# Patient Record
Sex: Male | Born: 1985 | Race: Black or African American | Hispanic: No | Marital: Single | Smoking: Former smoker
Health system: Southern US, Community
[De-identification: ages and names within clinical notes are randomized; demographics above are authoritative.]

## PROBLEM LIST (undated history)

## (undated) DIAGNOSIS — I499 Cardiac arrhythmia, unspecified: Secondary | ICD-10-CM

## (undated) DIAGNOSIS — R030 Elevated blood-pressure reading, without diagnosis of hypertension: Secondary | ICD-10-CM

## (undated) HISTORY — PX: NO PAST SURGERIES: SHX2092

## (undated) HISTORY — DX: Cardiac arrhythmia, unspecified: I49.9

## (undated) HISTORY — DX: Elevated blood-pressure reading, without diagnosis of hypertension: R03.0

---

## 2007-09-09 ENCOUNTER — Emergency Department (HOSPITAL_COMMUNITY): Admission: EM | Admit: 2007-09-09 | Discharge: 2007-09-09 | Payer: Self-pay | Admitting: Emergency Medicine

## 2009-05-26 ENCOUNTER — Emergency Department (HOSPITAL_COMMUNITY): Admission: EM | Admit: 2009-05-26 | Discharge: 2009-05-26 | Payer: Self-pay | Admitting: Emergency Medicine

## 2015-03-06 ENCOUNTER — Encounter (HOSPITAL_COMMUNITY): Payer: Self-pay

## 2015-03-06 DIAGNOSIS — R079 Chest pain, unspecified: Secondary | ICD-10-CM | POA: Diagnosis not present

## 2015-03-06 DIAGNOSIS — Z87891 Personal history of nicotine dependence: Secondary | ICD-10-CM | POA: Diagnosis not present

## 2015-03-06 NOTE — ED Notes (Signed)
Pt here with c/o left sided chest pain/tightness that radiates down his left arm. This pain has been intermittent for the past 2-3 weeks but tonight's episode started while he was at work around 2230. Denies any other symptoms.

## 2015-03-07 ENCOUNTER — Emergency Department (HOSPITAL_COMMUNITY)
Admission: EM | Admit: 2015-03-07 | Discharge: 2015-03-07 | Disposition: A | Discharge status: Home / Self Care | Payer: 59 | Attending: Emergency Medicine | Admitting: Emergency Medicine

## 2015-03-07 ENCOUNTER — Emergency Department (HOSPITAL_COMMUNITY): Payer: 59

## 2015-03-07 DIAGNOSIS — R079 Chest pain, unspecified: Secondary | ICD-10-CM

## 2015-03-07 LAB — BASIC METABOLIC PANEL
Anion gap: 8 (ref 5–15)
BUN: 9 mg/dL (ref 6–20)
CALCIUM: 9.4 mg/dL (ref 8.9–10.3)
CHLORIDE: 102 mmol/L (ref 101–111)
CO2: 28 mmol/L (ref 22–32)
CREATININE: 1.18 mg/dL (ref 0.61–1.24)
GFR calc Af Amer: >60 mL/min (ref >60)
GFR calc non Af Amer: >60 mL/min (ref >60)
GLUCOSE: 115 mg/dL — AB (ref 65–99)
Potassium: 3.4 mmol/L — ABNORMAL LOW (ref 3.5–5.1)
Sodium: 138 mmol/L (ref 135–145)

## 2015-03-07 LAB — I-STAT TROPONIN, ED
TROPONIN I, POC: 0 ng/mL (ref 0.00–0.08)
TROPONIN I, POC: 0.01 ng/mL (ref 0.00–0.08)

## 2015-03-07 LAB — CBC
HEMATOCRIT: 42.4 % (ref 39.0–52.0)
HEMOGLOBIN: 14.2 g/dL (ref 13.0–17.0)
MCH: 28.7 pg (ref 26.0–34.0)
MCHC: 33.5 g/dL (ref 30.0–36.0)
MCV: 85.7 fL (ref 78.0–100.0)
Platelets: 283 10*3/uL (ref 150–400)
RBC: 4.95 MIL/uL (ref 4.22–5.81)
RDW: 12.7 % (ref 11.5–15.5)
WBC: 6 10*3/uL (ref 4.0–10.5)

## 2015-03-07 NOTE — Discharge Instructions (Signed)
Follow-up with cardiology to discuss the possibility of a stress test.  The contact information for [facility] has been provided in this discharge summary. Please call to arrange this appointment, ideally sometime this week.  Return to the ER if symptoms significantly worsen or change.   Nonspecific Chest Pain  Chest pain can be caused by many different conditions. There is always a chance that your pain could be related to something serious, such as a heart attack or a blood clot in your lungs. Chest pain can also be caused by conditions that are not life-threatening. If you have chest pain, it is very important to follow up with your health care provider. CAUSES  Chest pain can be caused by:  Heartburn.  Pneumonia or bronchitis.  Anxiety or stress.  Inflammation around your heart (pericarditis) or lung (pleuritis or pleurisy).  A blood clot in your lung.  A collapsed lung (pneumothorax). It can develop suddenly on its own (spontaneous pneumothorax) or from trauma to the chest.  Shingles infection (varicella-zoster virus).  Heart attack.  Damage to the bones, muscles, and cartilage that make up your chest wall. This can include:  Bruised bones due to injury.  Strained muscles or cartilage due to frequent or repeated coughing or overwork.  Fracture to one or more ribs.  Sore cartilage due to inflammation (costochondritis). RISK FACTORS  Risk factors for chest pain may include:  Activities that increase your risk for trauma or injury to your chest.  Respiratory infections or conditions that cause frequent coughing.  Medical conditions or overeating that can cause heartburn.  Heart disease or family history of heart disease.  Conditions or health behaviors that increase your risk of developing a blood clot.  Having had chicken pox (varicella zoster). SIGNS AND SYMPTOMS Chest pain can feel like:  Burning or tingling on the surface of your  chest or deep in your chest.  Crushing, pressure, aching, or squeezing pain.  Dull or sharp pain that is worse when you move, cough, or take a deep breath.  Pain that is also felt in your back, neck, shoulder, or arm, or pain that spreads to any of these areas. Your chest pain may come and go, or it may stay constant. DIAGNOSIS Lab tests or other studies may be needed to find the cause of your pain. Your health care provider may have you take a test called an ambulatory ECG (electrocardiogram). An ECG records your heartbeat patterns at the time the test is performed. You may also have other tests, such as:  Transthoracic echocardiogram (TTE). During echocardiography, sound waves are used to create a picture of all of the heart structures and to look at how blood flows through your heart.  Transesophageal echocardiogram (TEE).This is a more advanced imaging test that obtains images from inside your body. It allows your health care provider to see your heart in finer detail.  Cardiac monitoring. This allows your health care provider to monitor your heart rate and rhythm in real time.  Holter monitor. This is a portable device that records your heartbeat and can help to diagnose abnormal heartbeats. It allows your health care provider to track your heart activity for several days, if needed.  Stress tests. These can be done through exercise or by taking medicine that makes your heart beat more quickly.  Blood tests.  Imaging tests. TREATMENT  Your treatment depends on what is causing your chest pain. Treatment may include:  Medicines. These may include:  Acid  blockers for heartburn.  Anti-inflammatory medicine.  Pain medicine for inflammatory conditions.  Antibiotic medicine, if an infection is present.  Medicines to dissolve blood clots.  Medicines to treat coronary artery disease.  Supportive care for conditions that do not require medicines. This may  include:  Resting.  Applying heat or cold packs to injured areas.  Limiting activities until pain decreases. HOME CARE INSTRUCTIONS  If you were prescribed an antibiotic medicine, finish it all even if you start to feel better.  Avoid any activities that bring on chest pain.  Do not use any tobacco products, including cigarettes, chewing tobacco, or electronic cigarettes. If you need help quitting, ask your health care provider.  Do not drink alcohol.  Take medicines only as directed by your health care provider.  Keep all follow-up visits as directed by your health care provider. This is important. This includes any further testing if your chest pain does not go away.  If heartburn is the cause for your chest pain, you may be told to keep your head raised (elevated) while sleeping. This reduces the chance that acid will go from your stomach into your esophagus.  Make lifestyle changes as directed by your health care provider. These may include:  Getting regular exercise. Ask your health care provider to suggest some activities that are safe for you.  Eating a heart-healthy diet. A registered dietitian can help you to learn healthy eating options.  Maintaining a healthy weight.  Managing diabetes, if necessary.  Reducing stress. SEEK MEDICAL CARE IF:  Your chest pain does not go away after treatment.  You have a rash with blisters on your chest.  You have a fever. SEEK IMMEDIATE MEDICAL CARE IF:   Your chest pain is worse.  You have an increasing cough, or you cough up blood.  You have severe abdominal pain.  You have severe weakness.  You faint.  You have chills.  You have sudden, unexplained chest discomfort.  You have sudden, unexplained discomfort in your arms, back, neck, or jaw.  You have shortness of breath at any time.  You suddenly start to sweat, or your skin gets clammy.  You feel nauseous or you vomit.  You suddenly feel light-headed or  dizzy.  Your heart begins to beat quickly, or it feels like it is skipping beats. These symptoms may represent a serious problem that is an emergency. Do not wait to see if the symptoms will go away. Get medical help right away. Call your local emergency services (911 in the [place] Do not drive yourself to the hospital.   This information is not intended to replace advice given to you by your health care provider. Make sure you discuss any questions you have with your health care provider.   Document Released: 11/16/2004 Document Revised: 02/27/2014 Document Reviewed: 09/12/2013 Elsevier Interactive Patient Education [employer].

## 2015-03-07 NOTE — ED Provider Notes (Signed)
CSN: [id number]     Arrival date & time 03/06/15  2337 History   By signing my name below, I, Terrance Branch, attest that this documentation has been prepared under the direction and in the presence of Geoffery Lyonsouglas Danish Ruffins, MD. Electronically Signed: Evon Slackerrance Branch, ED Scribe. 03/07/2015. 2:03 AM.    Chief Complaint  Patient presents with  . Chest Pain   Patient is a 30 y.o. male presenting with chest pain. The history is provided by the patient. No language interpreter was used.  Chest Pain Associated symptoms: no diaphoresis and no shortness of breath    HPI Comments: Freeman CaldronKelly Curci is a 30 y.o. male who presents to the Emergency Department complaining of intermittent left sided onset 2-3 weeks prior. He states over the last 2-3 weeks he would have a sharp chest pain ever 2-3 days that would resolve on its own. Pt states that the pain has recently worsened 1 night prior that resolved after a few minutes. Pt states that the pain radiated to the left arm. Pt states that he tried a BC powder. Denies any worsening factors. Denies diaphoresis, SOB, leg swelling or abdominal pain. Reports Hx of recently quitting smoking. Denies family Hx of CAD.   History reviewed. No pertinent past medical history. History reviewed. No pertinent past surgical history. No family history on file. Social History  Substance Use Topics  . Smoking status: Former Smoker    Quit date: 11/04/2014  . Smokeless tobacco: None  . Alcohol Use: Yes    Review of Systems  Constitutional: Negative for diaphoresis.  Respiratory: Negative for shortness of breath.   Cardiovascular: Positive for chest pain. Negative for leg swelling.  All other systems reviewed and are negative.    Allergies  Review of patient's allergies indicates not on file.  Home Medications   Prior to Admission medications   Not on File   BP 148/94 mmHg  Pulse 76  Temp(Src) 98.4 F (36.9 C) (Oral)  Resp 17  Ht 6' (1.829 m)  Wt 269 lb (122.018  kg)  BMI 36.48 kg/m2  SpO2 100%   Physical Exam  Constitutional: He is oriented to person, place, and time. He appears well-developed and well-nourished. No distress.  HENT:  Head: Normocephalic and atraumatic.  Eyes: Conjunctivae and EOM are normal.  Neck: Neck supple. No tracheal deviation present.  Cardiovascular: Normal rate, regular rhythm and normal heart sounds.   Pulmonary/Chest: Effort normal. No respiratory distress. He has no wheezes. He has no rales.  Abdominal: Soft. Bowel sounds are normal. He exhibits no distension. There is no tenderness. There is no rebound and no guarding.  Musculoskeletal: Normal range of motion.  Neurological: He is alert and oriented to person, place, and time.  Skin: Skin is warm and dry.  Psychiatric: He has a normal mood and affect. His behavior is normal.  Nursing note and vitals reviewed.   ED Course  Procedures (including critical care time) DIAGNOSTIC STUDIES: Oxygen Saturation is 100% on RA, normal by my interpretation.    COORDINATION OF CARE: 2:03 AM-Discussed treatment plan with pt at bedside and pt agreed to plan.     Labs Review Labs Reviewed  BASIC METABOLIC PANEL - Abnormal; Notable for the following:    Potassium 3.4 (*)    Glucose, Bld 115 (*)    All other components within normal limits  CBC  I-STAT TROPOININ, ED    Imaging Review Dg Chest 2 View  03/07/2015  CLINICAL DATA:  LEFT chest pain for 2  weeks, worsening tonight at work. EXAM: CHEST  2 VIEW COMPARISON:  None. FINDINGS: Cardiomediastinal silhouette is normal. The lungs are clear without pleural effusions or focal consolidations. Trachea projects midline and there is no pneumothorax. Soft tissue planes and included osseous structures are non-suspicious. IMPRESSION: Normal chest. Electronically Signed   By: Awilda Metro M.D.   On: 03/07/2015 00:24   I have personally reviewed and evaluated these images and lab results as part of my medical  decision-making.   EKG Interpretation   Date/Time:  Saturday March 06 2015 23:39:13 EST Ventricular Rate:  70 PR Interval:  166 QRS Duration: 90 QT Interval:  376 QTC Calculation: 406 R Axis:   96 Text Interpretation:  Normal sinus rhythm Rightward axis Nonspecific T  wave abnormality Abnormal ECG Confirmed by Conrad Zajkowski  MD, Broadus Costilla ([id number]) on  03/07/2015 1:57:41 AM      MDM   Final diagnoses:  None      Patient presents with complaints of left-sided chest discomfort. He reports intermittent episodes of this for the past several weeks. He has no cardiac risk factors and his symptoms are somewhat atypical for cardiac pain. His workup so far reveals nonspecific T wave changes on his EKG and negative troponin 2. He has been pain-free throughout his emergency department stay. I feel as though he is appropriate for discharge, however feel as though a stress test may be in his best interest due to his EKG. These findings may be his baseline, however there are no prior EKGs for comparison. To return as needed if his symptoms worsen or change.   I personally performed the services described in this documentation, which was scribed in my presence. The recorded information has been reviewed and is accurate.       Geoffery Lyons, MD 03/07/15 [phone]

## 2015-03-11 ENCOUNTER — Ambulatory Visit (INDEPENDENT_AMBULATORY_CARE_PROVIDER_SITE_OTHER): Payer: 59 | Admitting: Physician Assistant

## 2015-03-11 VITALS — BP 132/82 | HR 77 | Ht 72.0 in | Wt 270.2 lb

## 2015-03-11 DIAGNOSIS — R079 Chest pain, unspecified: Secondary | ICD-10-CM

## 2015-03-11 DIAGNOSIS — R9431 Abnormal electrocardiogram [ECG] [EKG]: Secondary | ICD-10-CM | POA: Diagnosis not present

## 2015-03-11 NOTE — Progress Notes (Signed)
Cardiology Office Note   Date:  03/11/2015   ID:  Jeffery Hensley, DOB Sep 17, 1985, MRN [id number]  PCP:  No PCP Per Patient Occasionally see [facility] Physician, has not seen [facility] in a long time Cardiologist:  New -  Seen with Dr. Clifton James DOD  Referred by Dr. Judd Lien from [facility] ED   Chief Complaint  Patient presents with  . New Patient (Initial Visit)    seen with DOD Dr. Clifton James  . Chest Pain      History of Present Illness: Jeffery Hensley is a 30 y.o. male who presents for new patient visit. He denies any past medical history other than pre-hypertension. He does have remote family history with his paternal grandfather died of MI later in life. There is no family history of early CAD before age 16. Both of his parents passed away when he was young from HIV. He was in his usual state of health until he started having chest discomfort in the last 2-3 weeks. Initially was occurring mainly with exertion lasting only 1 to 2 minutes long. It is relieved by rest. He described as a pain under his left breast radiating to under his left armpit. He denies any shortness of breath, dizziness, or diaphoresis. He was driving back to town last Saturday when he had a particularly strong episode while driving. It also lasted no more than 2 minutes. However he decided to seek medical attention at [facility] ED. Troponin was negative. EKG showed nonspecific T-wave changes. He was discharged from the ED and referred to outpatient cardiology evaluation.  He presents today for outpatient cardiology evaluation. He says the last episode of chest pain was before he went to the ED. Although he did have mild chest discomfort last night, he attributed to eating too fast. He has not had any significant symptom associated with chest discomfort. He denies any recent fever, chill, cough, lower extremity edema, orthopnea or paroxysmal nocturnal dyspnea. The last time he did marijuana was 2-3 weeks ago. He has no  significant risk factor other than former smoker and prehypertension. EKG obtained in the office however showed T-wave inversion in lead III, aVF, V5 and V6.    Past Medical History  Diagnosis Date  . Arrhythmia   . Pre-hypertension     Past Surgical History  Procedure Laterality Date  . No past surgeries       No current outpatient prescriptions on file.   No current facility-administered medications for this visit.    Allergies:   Review of patient's allergies indicates no known allergies.    Social History:  The patient  reports that he quit smoking about 4 months ago. He does not have any smokeless tobacco history on file. He reports that he drinks alcohol. He reports that he uses illicit drugs.   Family History:  The patient's family history includes Heart attack in his maternal grandfather.    ROS:  Please see the history of present illness.   Otherwise, review of systems are positive for chest pain.   All other systems are reviewed and negative.    PHYSICAL EXAM: VS:  BP 132/82 mmHg  Pulse 77  Ht 6' (1.829 m)  Wt 270 lb 3.2 oz (122.562 kg)  BMI 36.64 kg/m2 , BMI Body mass index is 36.64 kg/(m^2). GEN: Well nourished, well developed, in no acute distress HEENT: normal Neck: no JVD, carotid bruits, or masses Cardiac: RRR; no murmurs, rubs, or gallops,no edema  Respiratory:  clear to  auscultation bilaterally, normal work of breathing GI: soft, nontender, nondistended, + BS MS: no deformity or atrophy Skin: warm and dry, no rash Neuro:  Strength and sensation are intact Psych: euthymic mood, full affect   EKG:  EKG is ordered today. The ekg ordered today demonstrates NSR with TWI in inferolateral leads   Recent Labs: 03/06/2015: BUN 9; Creatinine, Ser 1.18; Hemoglobin 14.2; Platelets 283; Potassium 3.4*; Sodium 138    Lipid Panel No results found for: CHOL, TRIG, HDL, CHOLHDL, VLDL, LDLCALC, LDLDIRECT    Wt Readings from Last 3 Encounters:  03/11/15  270 lb 3.2 oz (122.562 kg)  03/06/15 269 lb (122.018 kg)      Other studies Reviewed: Additional studies/ records that were reviewed today include:  Recent ED record, negative trop x 2, presented for chest pain, discharged from ED  Review of the above records demonstrates:  No significant PMH, seen recently for chest pain   ASSESSMENT AND PLAN:  1.  Intermittent chest pain with abnormal EKG  - EKG showed TWI in lead III, aVF, V5-V6, discussed with Dr. Clifton James, will obtain echo to r/o structural heart disease, obtain outpatient treadmill nuclear stress test. I will see him back in 2 weeks after his tests  - obtain lipid test and hgb A1C to r/o risk factors which may contribute to CAD  2. Pre-hypertension: will hold off on adding medication at this time unless objective finding of CAD, given his young age, will advise diet and exercise first   Current medicines are reviewed at length with the patient today.  The patient does not have concerns regarding medicines.  The following changes have been made:  no change  Labs/ tests ordered today include:   Orders Placed This Encounter  Procedures  . Lipid panel  . HgB A1c  . Myocardial Perfusion Imaging  . EKG 12-Lead  . Echocardiogram     Disposition:   FU with me in 2 weeks  Signed, Azalee Course, [place]  03/11/2015 2:45 PM    [facility] [address] [place], [place], [place]  [address] Phone: [phone]; Fax: [phone]   I have personally seen and examined this patient witih Azalee Course, PA-C I agree with the assessment and plan as outlined above. Given his chest pain and EKG changes, will arrange stress test and echo.   MCALHANY,CHRISTOPHER 03/11/2015 10:44 PM

## 2015-03-11 NOTE — Patient Instructions (Addendum)
Medication Instructions:  Your physician recommends that you continue on your current medications as directed. Please refer to the Current Medication list given to you today.   Labwork: AT TIME OF TEST:  FASTING LIPID & HGBA1C  Testing/Procedures: Your physician has requested that you have an echocardiogram. Echocardiography is a painless test that uses sound waves to create images of your heart. It provides your doctor with information about the size and shape of your heart and how well your heart's chambers and valves are working. This procedure takes approximately one hour. There are no restrictions for this procedure.   Your physician has requested that you have en exercise stress myoview. For further information please visit [email or web address] Please follow instruction sheet, as given.    Follow-Up: Your physician recommends that you schedule a follow-up appointment in: 2 WEEKS (AFTER TESTS HAVE BEEN COMPLETED) WITH HAO MENG, PA-C   Any Other Special Instructions Will Be Listed Below (If Applicable).  IF YOU HAVE CHEST PAIN THAT PERSIST MORE THAN 30 MINUTES, PLEASE SEEK MEDICAL TREATMENT OR GO TO THE NEAREST EMERGENCY ROOM   Echocardiogram An echocardiogram, or echocardiography, uses sound waves (ultrasound) to produce an image of your heart. The echocardiogram is simple, painless, obtained within a short period of time, and offers valuable information to your health care provider. The images from an echocardiogram can provide information such as:  Evidence of coronary artery disease (CAD).  Heart size.  Heart muscle function.  Heart valve function.  Aneurysm detection.  Evidence of a past heart attack.  Fluid buildup around the heart.  Heart muscle thickening.  Assess heart valve function. LET [facility] CARE PROVIDER KNOW ABOUT:  Any allergies you have.  All medicines you are taking, including vitamins, herbs, eye drops, creams, and over-the-counter  medicines.  Previous problems you or members of your family have had with the use of anesthetics.  Any blood disorders you have.  Previous surgeries you have had.  Medical conditions you have.  Possibility of pregnancy, if this applies. BEFORE THE PROCEDURE  No special preparation is needed. Eat and drink normally.  PROCEDURE   In order to produce an image of your heart, gel will be applied to your chest and a wand-like tool (transducer) will be moved over your chest. The gel will help transmit the sound waves from the transducer. The sound waves will harmlessly bounce off your heart to allow the heart images to be captured in real-time motion. These images will then be recorded.  You may need an IV to receive a medicine that improves the quality of the pictures. AFTER THE PROCEDURE You may return to your normal schedule including diet, activities, and medicines, unless your health care provider tells you otherwise.   This information is not intended to replace advice given to you by your health care provider. Make sure you discuss any questions you have with your health care provider.   Document Released: 02/04/2000 Document Revised: 02/27/2014 Document Reviewed: 10/14/2012 Elsevier Interactive Patient Education [employer]    If you need a refill on your cardiac medications before your next appointment, please call your pharmacy.

## 2015-03-23 ENCOUNTER — Telehealth (HOSPITAL_COMMUNITY): Payer: Self-pay | Admitting: Radiology

## 2015-03-23 ENCOUNTER — Ambulatory Visit: Payer: 59 | Admitting: Physician Assistant

## 2015-03-23 NOTE — Telephone Encounter (Signed)
Patient given detailed instructions per Myocardial Perfusion Study Information Sheet for the test on 03/25/2015 at 12:30. Patient notified to arrive 15 minutes early and that it is imperative to arrive on time for appointment to keep from having the test rescheduled.  If you need to cancel or reschedule your appointment, please call the office within 24 hours of your appointment. Failure to do so may result in a cancellation of your appointment, and a $50 no show fee. Patient verbalized understanding.EHK   

## 2015-03-25 ENCOUNTER — Ambulatory Visit (HOSPITAL_COMMUNITY): Payer: 59 | Attending: Cardiovascular Disease

## 2015-03-25 ENCOUNTER — Other Ambulatory Visit: Payer: Self-pay

## 2015-03-25 ENCOUNTER — Ambulatory Visit (HOSPITAL_BASED_OUTPATIENT_CLINIC_OR_DEPARTMENT_OTHER): Payer: 59

## 2015-03-25 DIAGNOSIS — R079 Chest pain, unspecified: Secondary | ICD-10-CM | POA: Diagnosis not present

## 2015-03-25 DIAGNOSIS — I517 Cardiomegaly: Secondary | ICD-10-CM | POA: Diagnosis not present

## 2015-03-25 DIAGNOSIS — I071 Rheumatic tricuspid insufficiency: Secondary | ICD-10-CM | POA: Diagnosis not present

## 2015-03-25 DIAGNOSIS — R0789 Other chest pain: Secondary | ICD-10-CM | POA: Diagnosis not present

## 2015-03-25 DIAGNOSIS — R9431 Abnormal electrocardiogram [ECG] [EKG]: Secondary | ICD-10-CM

## 2015-03-25 LAB — MYOCARDIAL PERFUSION IMAGING
CHL CUP NUCLEAR SRS: 1
CHL CUP RESTING HR STRESS: 73 {beats}/min
CSEPED: 9 min
CSEPEDS: 0 s
CSEPPHR: 171 {beats}/min
Estimated workload: 10.1 METS
LV dias vol: 131 mL
LVSYSVOL: 52 mL
MPHR: 192 {beats}/min
NUC STRESS TID: 1.13
Percent HR: 89 %
RATE: 0.36
SDS: 1
SSS: 2

## 2015-03-25 MED ORDER — TECHNETIUM TC 99M SESTAMIBI GENERIC - CARDIOLITE
32.9000 | Freq: Once | INTRAVENOUS | Status: AC | PRN
  Administered 2015-03-25: 32.9 via INTRAVENOUS

## 2015-03-25 MED ORDER — TECHNETIUM TC 99M SESTAMIBI GENERIC - CARDIOLITE
10.9000 | Freq: Once | INTRAVENOUS | Status: AC | PRN
  Administered 2015-03-25: 10.9 via INTRAVENOUS

## 2015-03-26 ENCOUNTER — Ambulatory Visit (HOSPITAL_COMMUNITY): Payer: 59

## 2015-04-07 ENCOUNTER — Encounter: Payer: Self-pay | Admitting: Physician Assistant

## 2015-04-07 ENCOUNTER — Ambulatory Visit (INDEPENDENT_AMBULATORY_CARE_PROVIDER_SITE_OTHER): Payer: 59 | Admitting: Physician Assistant

## 2015-04-07 VITALS — BP 138/98 | HR 76 | Ht 72.0 in | Wt 267.0 lb

## 2015-04-07 DIAGNOSIS — R079 Chest pain, unspecified: Secondary | ICD-10-CM

## 2015-04-07 DIAGNOSIS — R9431 Abnormal electrocardiogram [ECG] [EKG]: Secondary | ICD-10-CM

## 2015-04-07 DIAGNOSIS — R030 Elevated blood-pressure reading, without diagnosis of hypertension: Secondary | ICD-10-CM

## 2015-04-07 NOTE — Progress Notes (Signed)
Cardiology Office Note   Date:  04/07/2015   ID:  Jeffery Hensley, DOB 1985-03-26, MRN [id number]  PCP:  No PCP Per Patient Occasionally see [facility] Physician, has not seen [facility] in a long time Cardiologist: Dr. Clifton James  Referred by Dr. Judd Lien from [facility] ED  Chief Complaint  Patient presents with  . Follow-up    seen for Dr. Clifton James      History of Present Illness: Jeffery Hensley is a 30 y.o. male who presents for cardiology followup. He denies any past medical history other than pre-hypertension. He does have remote family history with his paternal grandfather died of MI later in life. There is no family history of early CAD before age 59. Both of his parents passed away when he was young from HIV. He was in his usual state of health until he started having chest discomfort in the last 2-3 weeks. Initially was occurring mainly with exertion lasting only 1 to 2 minutes long. It is relieved by rest. He described as a pain under his left breast radiating to under his left armpit. He denies any shortness of breath, dizziness, or diaphoresis. He was driving back to town on 4/09 when he had a particularly strong episode while driving. It also lasted no more than 2 minutes. However he decided to seek medical attention at [facility] ED. Troponin was negative. EKG showed nonspecific T-wave changes. He was discharged from the ED and referred to outpatient cardiology evaluation. Given his family history and his exertional type of symptom, we have obtained an echocardiogram and a treadmill stress test. The treadmill stress test shows normal EF with no ischemia or previous infarction. He denies any chest discomfort with exertion on the treadmill. Echocardiogram obtained on 03/25/2015 showed EF 60-65%, no regional wall motion abnormality, no pericardial effusion, mild LVH. Since our last visit, patient has been essentially chest pain-free until earlier today when he did have one episode of chest pain  however it went away and has not recurred since. Interestingly during the treadmill Myoview, he did not have any chest discomfort with exertion on the treadmill.    Past Medical History  Diagnosis Date  . Arrhythmia   . Pre-hypertension     Past Surgical History  Procedure Laterality Date  . No past surgeries       No current outpatient prescriptions on file.   No current facility-administered medications for this visit.    Allergies:   Review of patient's allergies indicates no known allergies.    Social History:  The patient  reports that he quit smoking about 5 months ago. He does not have any smokeless tobacco history on file. He reports that he drinks alcohol. He reports that he uses illicit drugs.   Family History:  The patient's family history includes Heart attack in his maternal grandfather.    ROS:  Please see the history of present illness.   Otherwise, review of systems are positive for one episode of chest discomfort in the last month.   All other systems are reviewed and negative.    PHYSICAL EXAM: VS:  BP 138/98 mmHg  Pulse 76  Ht 6' (1.829 m)  Wt 267 lb (121.11 kg)  BMI 36.20 kg/m2 , BMI Body mass index is 36.2 kg/(m^2). GEN: Well nourished, well developed, in no acute distress HEENT: normal Neck: no JVD, carotid bruits, or masses Cardiac: RRR; no murmurs, rubs, or gallops,no edema  Respiratory:  clear to auscultation bilaterally, normal work of breathing  GI: soft, nontender, nondistended, + BS MS: no deformity or atrophy Skin: warm and dry, no rash Neuro:  Strength and sensation are intact Psych: euthymic mood, full affect   EKG:  EKG is ordered today. The ekg ordered today demonstrates NSR with TWI in lead III, otherwise normal EKG   Recent Labs: 03/06/2015: BUN 9; Creatinine, Ser 1.18; Hemoglobin 14.2; Platelets 283; Potassium 3.4*; Sodium 138    Lipid Panel No results found for: CHOL, TRIG, HDL, CHOLHDL, VLDL, LDLCALC, LDLDIRECT    Wt  Readings from Last 3 Encounters:  04/07/15 267 lb (121.11 kg)  03/25/15 270 lb (122.471 kg)  03/11/15 270 lb 3.2 oz (122.562 kg)      Other studies Reviewed: Additional studies/ records that were reviewed today include:   Echocardiogram 03/25/2015 LV EF: 60% -  65%  ------------------------------------------------------------------- Indications:   Chest Pain (R07.9).  ------------------------------------------------------------------- Study Conclusions  - Left ventricle: The cavity size was normal. Wall thickness was increased in a pattern of mild LVH. Systolic function was normal. The estimated ejection fraction was in the range of 60% to 65%. Wall motion was normal; there were no regional wall motion abnormalities. Left ventricular diastolic function parameters were normal. - Atrial septum: No defect or patent foramen ovale was identified. - Tricuspid valve: There was trivial regurgitation. - Pulmonary arteries: Systolic pressure could not be accurately estimated. - Pericardium, extracardiac: There was no pericardial effusion.  Impressions:  - Mild LVH with LVEF 60-65% and normal diastolic function. Trivial tricuspid regurgitation.    Treadmill myoview 03/25/2015 Study Highlights     Nuclear stress EF: 61%.  Blood pressure demonstrated a hypertensive response to exercise.  There was no ST segment deviation noted during stress.  The study is normal.  The left ventricular ejection fraction is normal (55-65%).  Normal stress nuclear study with no ischemia or infarction; EF 61 with normal wall motion.      Review of the above records demonstrates:   Atypical chest, negative workup with treadmill myoview and echo   ASSESSMENT AND PLAN:  1.  Atypical chest pain: negative myoview and echocardiogram. No further workup is necessary in this case. The patient will continue to use diet and exercise to help with controlling his blood pressure. I am  hesitant to start a blood pressure medication in someone who is is so young. If his blood pressure is not improved after an exercise, he may need medical management at a later time. I have advised him to follow-up with internal medicine doctor. As this time, he does not need any regular follow-up with cardiology, he may contact cardiology and follow-up on an as-needed basis.    Current medicines are reviewed at length with the patient today.  The patient does not have concerns regarding medicines.  The following changes have been made:  no change  Labs/ tests ordered today include:   Orders Placed This Encounter  Procedures  . EKG 12-Lead     Disposition:   FU with cardiology on an as needed basis  Ramond Dial, [place]  04/07/2015 9:25 PM    [facility] [address] [place], [place], [place]  [address] Phone: [phone]; Fax: [phone]

## 2015-04-07 NOTE — Patient Instructions (Signed)
Medication Instructions:  Your physician recommends that you continue on your current medications as directed. Please refer to the Current Medication list given to you today.   Labwork: None ordered  Testing/Procedures: None ordered  Follow-Up: Your physician recommends that you schedule a follow-up appointment AS NEEDED  You need to set up with a Primary Care Physician for pre-hypertension  Watch your diet to help with the pre-hypertension.  Any Other Special Instructions Will Be Listed Below (If Applicable).     If you need a refill on your cardiac medications before your next appointment, please call your pharmacy.

## 2018-01-21 IMAGING — NM NM MISC PROCEDURE
3 series · 18 of 18 positions shown · non-contrast
Comparison: none

[Series 1: wbr_s-proj_st stress_(id)_sa · 6.5mm · 6.51mm/px · 6 of 64 frames shown (1 of 2)]
[frame 6/64]
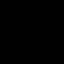
[frame 16/64]
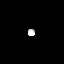
[frame 27/64]
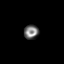
[frame 38/64]
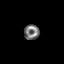
[frame 48/64]
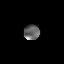
[frame 59/64]
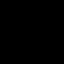

[Series 1: wbr_s-proj_st stress_(id)_sa · 6.5mm · 6.51mm/px · 6 of 512 frames shown (2 of 2)]
[frame 43/512]
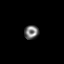
[frame 128/512]
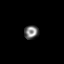
[frame 214/512]
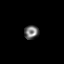
[frame 299/512]
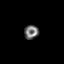
[frame 384/512]
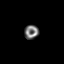
[frame 470/512]
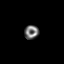

[Series 1: wbr_r-proj_st rest_(id)_sa · 6.5mm · 6.51mm/px · 6 of 64 frames shown]
[frame 6/64]
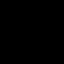
[frame 16/64]
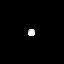
[frame 27/64]
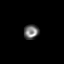
[frame 38/64]
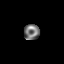
[frame 48/64]
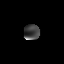
[frame 59/64]
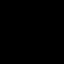

[18 of 18 positions shown; findings below may reference images not displayed]

Canned report from images found in remote index.

Refer to host system for actual result text.

## 2022-11-17 ENCOUNTER — Other Ambulatory Visit: Payer: Self-pay | Admitting: Sports Medicine

## 2022-11-17 DIAGNOSIS — M25561 Pain in right knee: Secondary | ICD-10-CM

## 2022-12-01 ENCOUNTER — Ambulatory Visit
Admission: RE | Admit: 2022-12-01 | Discharge: 2022-12-01 | Disposition: A | Discharge status: Home / Self Care | Payer: 59 | Source: Ambulatory Visit | Attending: Sports Medicine | Admitting: Sports Medicine

## 2022-12-01 DIAGNOSIS — M25561 Pain in right knee: Secondary | ICD-10-CM
